# Patient Record
Sex: Male | Born: 2000 | Race: Black or African American | Hispanic: No | Marital: Single | Smoking: Never smoker
Health system: Southern US, Community
[De-identification: ages and names within clinical notes are randomized; demographics above are authoritative.]

## PROBLEM LIST (undated history)

## (undated) DIAGNOSIS — F909 Attention-deficit hyperactivity disorder, unspecified type: Secondary | ICD-10-CM

---
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
## 2023-04-02 ENCOUNTER — Ambulatory Visit
Admission: EM | Admit: 2023-04-02 | Discharge: 2023-04-02 | Disposition: A | Discharge status: Home / Self Care | Payer: 59 | Attending: Emergency Medicine | Admitting: Emergency Medicine

## 2023-04-02 ENCOUNTER — Encounter: Payer: Self-pay | Admitting: Emergency Medicine

## 2023-04-02 DIAGNOSIS — B349 Viral infection, unspecified: Secondary | ICD-10-CM

## 2023-04-02 LAB — POCT INFLUENZA A/B
Influenza A, POC: NEGATIVE
Influenza B, POC: NEGATIVE

## 2023-04-02 MED ORDER — ONDANSETRON HCL 4 MG PO TABS: 4.0000 mg | ORAL_TABLET | Freq: Three times a day (TID) | ORAL | 0 refills | Status: AC | PRN

## 2023-04-02 NOTE — Discharge Instructions (Addendum)
Your flu tests are negative. Most likely you have a viral illness: no antibiotic as indicated at this time, May treat with OTC meds of choice. Make sure to drink plenty of fluids to stay hydrated(gatorade, water, popsicles,jello,etc), avoid caffeine products. Follow up with PCP. Return as needed.  Take Zofran as prescribed for nausea.

## 2023-04-02 NOTE — ED Triage Notes (Signed)
Fatigue, runny nose, starting yesterday. Reports cough, generalized body aches, nausea and vomiting. Denies any OTC medication use

## 2023-04-02 NOTE — ED Provider Notes (Addendum)
[facility]    CSN: [id number] Arrival date & time: 04/02/23  0845      History   Chief Complaint No chief complaint on file.   HPI Michael Baird is a 22 y.o. male.   22 year old male pt, Michael Baird, presents to urgent care for evaluation of fatigue, runny nose, cough, generalized bodyaches, nausea vomiting  x 1 that started yesterday.  Denies any OTC use.  The history is provided by the patient. No language interpreter was used.    Past Medical History:  Diagnosis Date   ADHD (attention deficit hyperactivity disorder)     Patient Active Problem List   Diagnosis Date Noted   Viral illness 04/02/2023    History reviewed. No pertinent surgical history.     Home Medications    Prior to Admission medications   Medication Sig Start Date End Date Taking? Authorizing Provider  ondansetron (ZOFRAN) 4 MG tablet Take 1 tablet (4 mg total) by mouth every 8 (eight) hours as needed for up to 3 days for nausea or vomiting. 04/02/23 04/05/23 Yes Donivan Thammavong, Para March, NP  lisdexamfetamine (VYVANSE) 40 MG capsule Take 40 mg by mouth every morning.    [provider]    Family History History reviewed. No pertinent family history.  Social History Social History   Tobacco Use   Smoking status: Never  Substance Use Topics   Alcohol use: No   Drug use: No     Allergies   Patient has no known allergies.   Review of Systems Review of Systems  Constitutional:  Positive for fatigue.  HENT:  Positive for rhinorrhea.   Respiratory:  Positive for cough.   Gastrointestinal:  Positive for nausea and vomiting. Negative for diarrhea.  Musculoskeletal:  Positive for myalgias.  All other systems reviewed and are negative.    Physical Exam Triage Vital Signs ED Triage Vitals  Encounter Vitals Group     BP 04/02/23 0911 133/74     Systolic BP Percentile --      Diastolic BP Percentile --      Pulse Rate 04/02/23 0911 88     Resp 04/02/23 0911 16      Temp 04/02/23 0911 98.7 F (37.1 C)     Temp Source 04/02/23 0911 Oral     SpO2 04/02/23 0911 97 %     Weight --      Height --      Head Circumference --      Peak Flow --      Pain Score 04/02/23 0914 5     Pain Loc --      Pain Education --      Exclude from Growth Chart --    No data found.  Updated Vital Signs BP 133/74 (BP Location: Left Arm)   Pulse 88   Temp 98.7 F (37.1 C) (Oral)   Resp 16   SpO2 97%   Visual Acuity Right Eye Distance:   Left Eye Distance:   Bilateral Distance:    Right Eye Near:   Left Eye Near:    Bilateral Near:     Physical Exam Vitals and nursing note reviewed.  Constitutional:      General: He is not in acute distress.    Appearance: He is well-developed.  HENT:     Head: Normocephalic and atraumatic.     Right Ear: Tympanic membrane is retracted.     Left Ear: Tympanic membrane is retracted.  Nose: Congestion present.     Mouth/Throat:     Lips: Pink.     Mouth: Mucous membranes are moist.     Pharynx: Oropharynx is clear. Uvula midline.  Eyes:     Conjunctiva/sclera: Conjunctivae normal.  Cardiovascular:     Rate and Rhythm: Normal rate and regular rhythm.     Pulses: Normal pulses.     Heart sounds: Normal heart sounds. No murmur heard. Pulmonary:     Effort: Pulmonary effort is normal. No respiratory distress.     Breath sounds: Normal breath sounds and air entry.  Abdominal:     Palpations: Abdomen is soft.     Tenderness: There is no abdominal tenderness.  Musculoskeletal:        General: No swelling.     Cervical back: Neck supple.  Skin:    General: Skin is warm and dry.     Capillary Refill: Capillary refill takes less than 2 seconds.  Neurological:     General: No focal deficit present.     Mental Status: He is alert and oriented to person, place, and time.     GCS: GCS eye subscore is 4. GCS verbal subscore is 5. GCS motor subscore is 6.     Cranial Nerves: No cranial nerve deficit.     Sensory: No  sensory deficit.  Psychiatric:        Attention and Perception: Attention normal.        Mood and Affect: Mood normal.        Speech: Speech normal.        Behavior: Behavior normal.      UC Treatments / Results  Labs (all labs ordered are listed, but only abnormal results are displayed) Labs Reviewed  POCT INFLUENZA A/B - Normal    EKG   Radiology No results found.  Procedures Procedures (including critical care time)  Medications Ordered in UC Medications - No data to display  Initial Impression / Assessment and Plan / UC Course  I have reviewed the triage vital signs and the nursing notes.  Pertinent labs & imaging results that were available during my care of the patient were reviewed by me and considered in my medical decision making (see chart for details).    Discussed exam findings and plan of care with patient, strict go to ER precautions given.   Patient verbalized understanding to this provider.  Ddx: Viral illness, allergies Final Clinical Impressions(s) / UC Diagnoses   Final diagnoses:  Viral illness     Discharge Instructions      Your flu tests are negative. Most likely you have a viral illness: no antibiotic as indicated at this time, May treat with OTC meds of choice. Make sure to drink plenty of fluids to stay hydrated(gatorade, water, popsicles,jello,etc), avoid caffeine products. Follow up with PCP. Return as needed.  Take Zofran as prescribed for nausea.     ED Prescriptions     Medication Sig Dispense Auth. Provider   ondansetron (ZOFRAN) 4 MG tablet Take 1 tablet (4 mg total) by mouth every 8 (eight) hours as needed for up to 3 days for nausea or vomiting. 9 tablet [name], Para March, NP      PDMP not reviewed this encounter.   Clancy Gourd, NP 04/02/23 1191    Clancy Gourd, NP 04/02/23 [phone]

## 2024-05-15 ENCOUNTER — Emergency Department (HOSPITAL_BASED_OUTPATIENT_CLINIC_OR_DEPARTMENT_OTHER)
Admission: EM | Admit: 2024-05-15 | Discharge: 2024-05-15 | Disposition: A | Discharge status: Home / Self Care | Attending: Emergency Medicine | Admitting: Emergency Medicine

## 2024-05-15 ENCOUNTER — Emergency Department (HOSPITAL_BASED_OUTPATIENT_CLINIC_OR_DEPARTMENT_OTHER)

## 2024-05-15 ENCOUNTER — Encounter (HOSPITAL_BASED_OUTPATIENT_CLINIC_OR_DEPARTMENT_OTHER): Payer: Self-pay

## 2024-05-15 ENCOUNTER — Other Ambulatory Visit: Payer: Self-pay

## 2024-05-15 DIAGNOSIS — K219 Gastro-esophageal reflux disease without esophagitis: Secondary | ICD-10-CM | POA: Insufficient documentation

## 2024-05-15 DIAGNOSIS — R1013 Epigastric pain: Secondary | ICD-10-CM | POA: Diagnosis present

## 2024-05-15 LAB — COMPREHENSIVE METABOLIC PANEL WITH GFR
ALT: 32 U/L (ref 0–44)
AST: 31 U/L (ref 15–41)
Albumin: 4.6 g/dL (ref 3.5–5.0)
Alkaline Phosphatase: 71 U/L (ref 38–126)
Anion gap: 11 (ref 5–15)
BUN: 8 mg/dL (ref 6–20)
CO2: 25 mmol/L (ref 22–32)
Calcium: 9.9 mg/dL (ref 8.9–10.3)
Chloride: 101 mmol/L (ref 98–111)
Creatinine, Ser: 0.86 mg/dL (ref 0.61–1.24)
GFR, Estimated: >60 mL/min
Glucose, Bld: 95 mg/dL (ref 70–99)
Potassium: 4.1 mmol/L (ref 3.5–5.1)
Sodium: 138 mmol/L (ref 135–145)
Total Bilirubin: 0.4 mg/dL (ref 0.0–1.2)
Total Protein: 8.2 g/dL — ABNORMAL HIGH (ref 6.5–8.1)

## 2024-05-15 LAB — CBC
HCT: 45.1 % (ref 39.0–52.0)
Hemoglobin: 15.2 g/dL (ref 13.0–17.0)
MCH: 26.3 pg (ref 26.0–34.0)
MCHC: 33.7 g/dL (ref 30.0–36.0)
MCV: 78 fL — ABNORMAL LOW (ref 80.0–100.0)
Platelets: 291 K/uL (ref 150–400)
RBC: 5.78 MIL/uL (ref 4.22–5.81)
RDW: 13.3 % (ref 11.5–15.5)
WBC: 7.3 K/uL (ref 4.0–10.5)
nRBC: 0 % (ref 0.0–0.2)

## 2024-05-15 LAB — LIPASE, BLOOD: Lipase: 14 U/L (ref 11–51)

## 2024-05-15 MED ORDER — IOHEXOL 300 MG/ML  SOLN
100.0000 mL | Freq: Once | INTRAMUSCULAR | Status: AC | PRN
  Administered 2024-05-15: 100 mL via INTRAVENOUS

## 2024-05-15 MED ORDER — LACTATED RINGERS IV BOLUS
1000.0000 mL | Freq: Once | INTRAVENOUS | Status: AC
  Administered 2024-05-15: 1000 mL via INTRAVENOUS

## 2024-05-15 MED ORDER — PANTOPRAZOLE SODIUM 40 MG PO TBEC: 40.0000 mg | DELAYED_RELEASE_TABLET | Freq: Every day | ORAL | 0 refills | Status: AC

## 2024-05-15 MED ORDER — PANTOPRAZOLE SODIUM 40 MG IV SOLR
40.0000 mg | Freq: Once | INTRAVENOUS | Status: AC
  Administered 2024-05-15: 40 mg via INTRAVENOUS
  Filled 2024-05-15: qty 10

## 2024-05-15 MED ORDER — FENTANYL CITRATE (PF) 50 MCG/ML IJ SOSY
50.0000 ug | PREFILLED_SYRINGE | Freq: Once | INTRAMUSCULAR | Status: AC
  Administered 2024-05-15: 50 ug via INTRAVENOUS
  Filled 2024-05-15: qty 1

## 2024-05-15 NOTE — Discharge Instructions (Signed)
 Your symptoms today seem to be due to acid reflux.  You have been prescribed a medication called Protonix  to take daily at home to help with symptoms.  Please avoid eating acidic foods, spicy foods as these can worsen symptoms. Do not eat within 2 hours of laying down/bedtime to prevent reflux flares.  Your kidney, liver, and pancreas labs are normal today.  Your blood counts and electrolytes were normal today. Your CT scan was normal  As discussed, I would recommend establishing care with a PCP for further management of your symptoms and further routine care.  Please return to the ER for any other new or concerning symptoms

## 2024-05-15 NOTE — ED Triage Notes (Signed)
 Patient reports central abdominal pain starting this morning with an episode of vomiting and current nausea. No hx of GI issues in the past. Reports able to drink water, but unable to eat.

## 2024-05-15 NOTE — ED Provider Notes (Signed)
 " [place] [facility] AT [facility] Provider Note   CSN: [id number] Arrival date & time: 05/15/24  1003     Patient presents with: Abdominal Pain   Michael Baird is a 24 y.o. male with no significant past medical history who presents with concern for epigastric abdominal pain that started early this morning.  Reports pain is constant, but has flares of worsening pain.  Reports pain feels sharp.  He also has associated nausea and vomiting.  Denies any fever or chills at home.  Reports normal bowel movements, no constipation or diarrhea.  Denies any penile discharge, testicular pain, dysuria, or increased frequency.  Denies any marijuana or alcohol use.   HPI     Prior to Admission medications  Medication Sig Start Date End Date Taking? Authorizing Provider  pantoprazole  (PROTONIX ) 40 MG tablet Take 1 tablet (40 mg total) by mouth daily. 05/15/24  Yes Veta Palma, PA-C  lisdexamfetamine (VYVANSE) 40 MG capsule Take 40 mg by mouth every morning.    [provider]    Allergies: Patient has no known allergies.    Review of Systems  Gastrointestinal:  Positive for abdominal pain.    Updated Vital Signs BP 118/64   Pulse 67   Temp 97.8 F (36.6 C) (Oral)   Resp 16   Ht 5' 8 (1.727 m)   Wt 102.1 kg   SpO2 99%   BMI 34.21 kg/m   Physical Exam Vitals and nursing note reviewed.  Constitutional:      General: He is not in acute distress.    Appearance: He is well-developed.  HENT:     Head: Normocephalic and atraumatic.  Eyes:     Conjunctiva/sclera: Conjunctivae normal.  Cardiovascular:     Rate and Rhythm: Normal rate and regular rhythm.     Heart sounds: No murmur heard. Pulmonary:     Effort: Pulmonary effort is normal. No respiratory distress.     Breath sounds: Normal breath sounds.  Abdominal:     Palpations: Abdomen is soft.     Tenderness: There is abdominal tenderness.     Comments: Mild epigastric abdominal tenderness to  palpation without rebound or guarding.  Musculoskeletal:        General: No swelling.     Cervical back: Neck supple.  Skin:    General: Skin is warm and dry.     Capillary Refill: Capillary refill takes less than 2 seconds.  Neurological:     Mental Status: He is alert.  Psychiatric:        Mood and Affect: Mood normal.     (all labs ordered are listed, but only abnormal results are displayed) Labs Reviewed  COMPREHENSIVE METABOLIC PANEL WITH GFR - Abnormal; Notable for the following components:      Result Value   Total Protein 8.2 (*)    All other components within normal limits  CBC - Abnormal; Notable for the following components:   MCV 78.0 (*)    All other components within normal limits  LIPASE, BLOOD  URINALYSIS, ROUTINE W REFLEX MICROSCOPIC    EKG: None  Radiology: CT ABDOMEN PELVIS W CONTRAST Result Date: 05/15/2024 EXAM: CT ABDOMEN AND PELVIS WITH CONTRAST 05/15/2024 11:25:42 AM TECHNIQUE: CT of the abdomen and pelvis was performed with the administration of 100 mL of iohexol  (OMNIPAQUE ) 300 MG/ML solution. Multiplanar reformatted images are provided for review. Automated exposure control, iterative reconstruction, and/or weight-based adjustment of the mA/kV was utilized to reduce the radiation dose to  as low as reasonably achievable. COMPARISON: None available. CLINICAL HISTORY: Epigastric abdominal pain. FINDINGS: LOWER CHEST: No acute abnormality. LIVER: The liver is unremarkable. GALLBLADDER AND BILE DUCTS: Gallbladder is unremarkable. No biliary ductal dilatation. SPLEEN: No acute abnormality. PANCREAS: No acute abnormality. ADRENAL GLANDS: No acute abnormality. KIDNEYS, URETERS AND BLADDER: No stones in the kidneys or ureters. No hydronephrosis. No perinephric or periureteral stranding. Urinary bladder is unremarkable. GI AND BOWEL: Stomach demonstrates no acute abnormality. There is no bowel obstruction. Normal appendix. PERITONEUM AND RETROPERITONEUM: No ascites.  No free air. No free pelvic fluid. VASCULATURE: Aorta is normal in caliber. LYMPH NODES: No lymphadenopathy. REPRODUCTIVE ORGANS: No acute abnormality. BONES AND SOFT TISSUES: No acute osseous abnormality. No focal soft tissue abnormality. IMPRESSION: 1. No acute findings in the abdomen or pelvis. Electronically signed by: Rogelia Myers MD 05/15/2024 12:54 PM EST RP Workstation: [id number]     Procedures   Medications Ordered in the ED  fentaNYL  (SUBLIMAZE ) injection 50 mcg (50 mcg Intravenous Given 05/15/24 1034)  lactated ringers  bolus 1,000 mL (0 mLs Intravenous Stopped 05/15/24 1200)  pantoprazole  (PROTONIX ) injection 40 mg (40 mg Intravenous Given 05/15/24 1034)  iohexol  (OMNIPAQUE ) 300 MG/ML solution 100 mL (100 mLs Intravenous Contrast Given 05/15/24 1120)    Clinical Course as of 05/15/24 1335  Thu May 15, 2024  1057 Patient reports pain has improved significantly with the Protonix  and fentanyl  given. [AF]    Clinical Course User Index [AF] Veta Palma, PA-C                                 Medical Decision Making Amount and/or Complexity of Data Reviewed Labs: ordered. Radiology: ordered.  Risk Prescription drug management.     Differential diagnosis includes but is not limited to Cannabinoid hyperemesis syndrome, acute cholecystitis, cholelithiasis, cholangitis, choledocholithiasis, peptic ulcer, gastritis, gastroenteritis, appendicitis, IBS, IBD, DKA, nephrolithiasis, UTI, pyelonephritis, pancreatitis, diverticulitis, mesenteric ischemia, abdominal aortic aneurysm, small bowel obstruction, volvulus, testicular torsion in males   ED Course:  Upon initial evaluation, patient is well appearing, no acute distress. Normal vitals.  Some mild epigastric tenderness, but no rebound or guarding.  No active vomiting.  Labs Ordered: I Ordered, and personally interpreted labs.  The pertinent results include:   CBC within normal limits CMP and lipase within normal  limits   Imaging Studies ordered: I ordered imaging studies including CT abdomen pelvis I independently visualized the imaging with scope of interpretation limited to determining acute life threatening conditions related to emergency care. Imaging showed no acute abnormality I agree with the radiologist interpretation   Medications Given: Fentanyl  Protonix  LR bolus  Upon re-evaluation, patient reports resolution of pain with the Protonix  and fentanyl  given.  His workup is reassuring, CBC, CMP, lipase within normal limits.  CT abdomen pelvis without acute normality.  Patient denying any urinary symptoms, penile discharge, doubt UTI or STI.  Given the epigastric pain, and patient also reports sometimes it will radiate up into his throat, suspect GERD, especially given symptoms improved with the Protonix .  Will have him continue on course of Protonix  at home.  Stable and appropriate for discharge home    Impression: Acid reflux  Disposition:  The patient was discharged home with instructions to take 30-day course of Protonix  as prescribed.  Discussed avoiding spicy or acidic foods and avoid laying down within 2 hours of eating. Return precautions given and patient verbalized understanding.     This chart was  dictated using voice recognition software, Dragon. Despite the best efforts of this provider to proofread and correct errors, errors may still occur which can change documentation meaning.       Final diagnoses:  Gastroesophageal reflux disease, unspecified whether esophagitis present    ED Discharge Orders          Ordered    pantoprazole  (PROTONIX ) 40 MG tablet  Daily        05/15/24 1318               Veta Palma, PA-C 05/15/24 1335    Mannie Pac T, DO 05/15/24 1450  "
